# Patient Record
Sex: Female | Born: 1970
Health system: Southern US, Community
[De-identification: ages and names within clinical notes are randomized; demographics above are authoritative.]

---

## 2004-07-10 ENCOUNTER — Observation Stay (HOSPITAL_COMMUNITY): Admission: RE | Admit: 2004-07-10 | Discharge: 2004-07-11 | Payer: Self-pay | Admitting: General Surgery

## 2004-07-10 ENCOUNTER — Encounter (INDEPENDENT_AMBULATORY_CARE_PROVIDER_SITE_OTHER): Payer: Self-pay | Admitting: Specialist

## 2005-05-21 ENCOUNTER — Ambulatory Visit (HOSPITAL_COMMUNITY): Admission: RE | Admit: 2005-05-21 | Discharge: 2005-05-21 | Payer: Self-pay | Admitting: Obstetrics and Gynecology

## 2005-06-01 ENCOUNTER — Ambulatory Visit (HOSPITAL_COMMUNITY): Admission: RE | Admit: 2005-06-01 | Discharge: 2005-06-01 | Payer: Self-pay | Admitting: Obstetrics and Gynecology

## 2005-06-12 ENCOUNTER — Ambulatory Visit (HOSPITAL_COMMUNITY): Admission: RE | Admit: 2005-06-12 | Discharge: 2005-06-12 | Payer: Self-pay | Admitting: Obstetrics and Gynecology

## 2006-04-26 IMAGING — US US OB COMP LESS 14 WK
1 series · 13 of 28 positions shown · non-contrast
Comparison: none

CLINICAL DATA: Assess viability.
EARLY OBSTETRICAL ULTRASOUND: 
Multiple images of the uterus and adnexa were obtained using transabdominal and endovaginal approaches.   There is a single intrauterine gestation identified which has a gestational age by crown rump length of 6 weeks and 1 day.   Positive regular fetal cardiac activity with a rate of 120 beats per minute was noted.   A normal appearing yolk sac is seen.  
Appearing to lie within the endometrial canal and directly adjacent to the gestational sac with surrounding decidual reaction is an echogenic soft tissue mass which measures 4.3 x 3.0 x 5.6 cm.  mall pocket of fluid is identified adjacent to this echogenic soft tissue density and contains low level echoes which may represent a subchorionic hemorrhage.   An area of markedly increased echogenicity is identified within this echogenic soft tissue density and although this does not demonstrate any posterior acoustical shadowing, this findings is suggestive of the possibility of calcifications.   Color Doppler assessment reveals flow within the soft tissue component of this.  Given the patient?s history of multiple prior losses, the possibility of retained products of conception from the prior early gestational loss in Monday January, 2005 would be a consideration, especially in light of the suspected calcification.   The possibility of a partial molar gestation or focal endometrial abnormality would be secondary considerations.   Correlation with quantitative Beta HCG may be helpful to assess for partial molar gestation.   Short term follow-up in 2 weeks to reassess the appearance and assess for interval change may also be helpful in terms of differentiating between these possibilities.   This is not felt to represent a subacute and acute component of a subchorionic hemorrhage due to the flow characteristics.   
Both ovaries are seen with the right ovary measuring 2.4 x 3.0 x 1.8 cm and the left ovary measuring 2.5 x 1.7 x 1.9 cm.  No cul-de-sac or periovarian fluid is seen and no separate adnexal masses are noted.

[Series 1: us ob comp less 14 wk · 0.37mm/px · 13 of 53 slices shown]
[im 2/53]
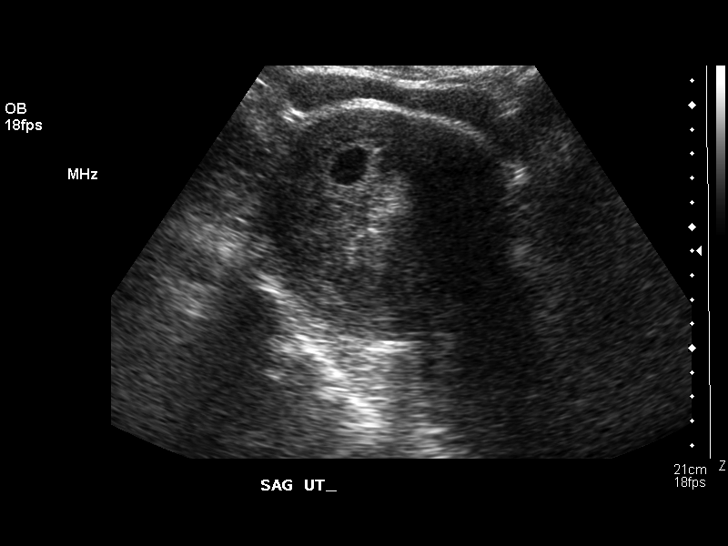
[im 6/53]
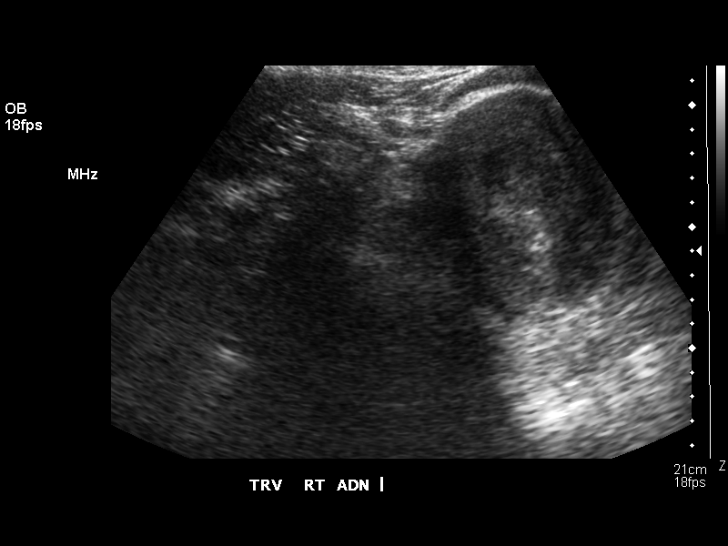
[im 10/53]
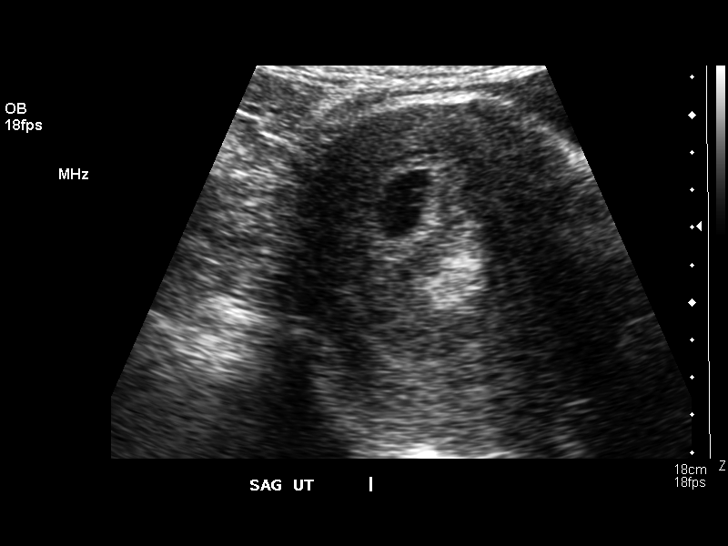
[im 14/53]
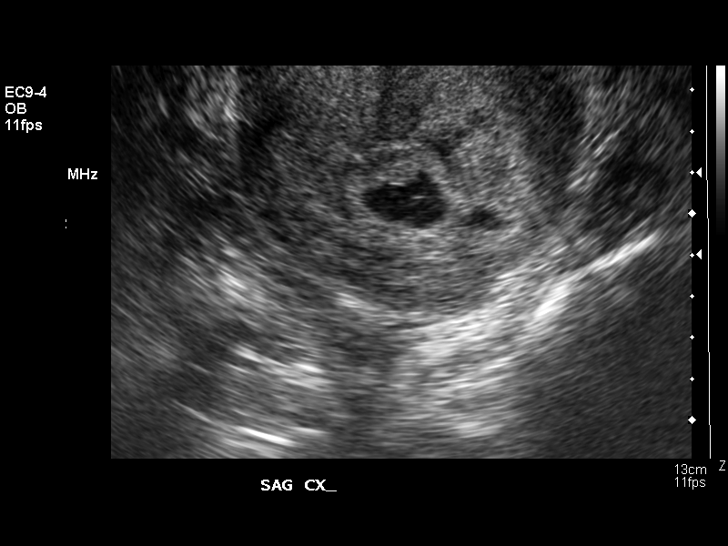
[im 18/53]
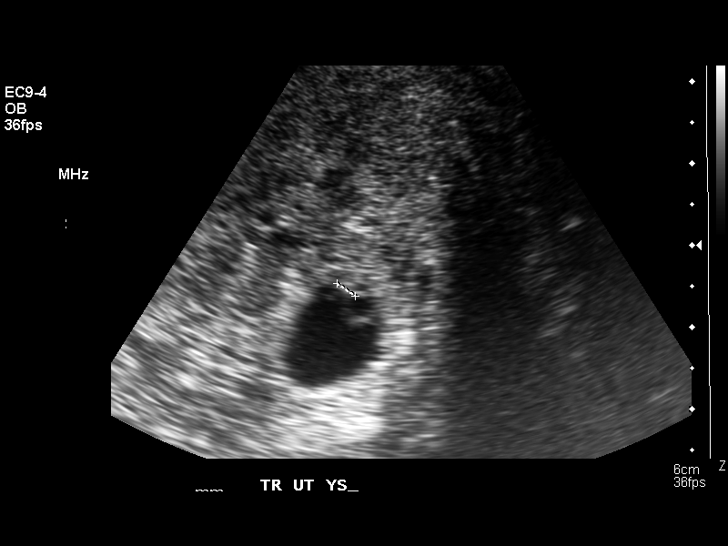
[im 22/53]
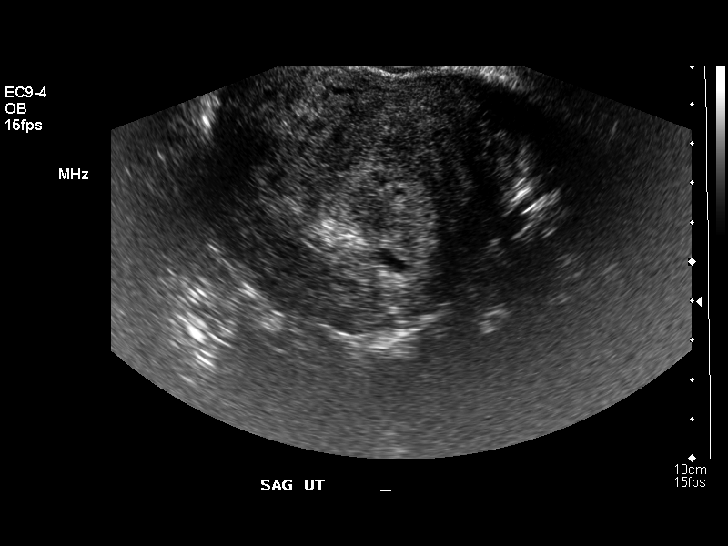
[im 27/53]
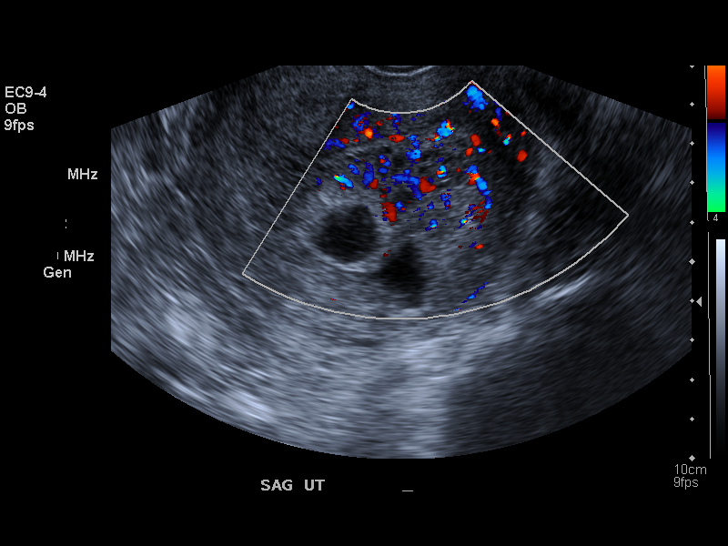
[im 31/53]
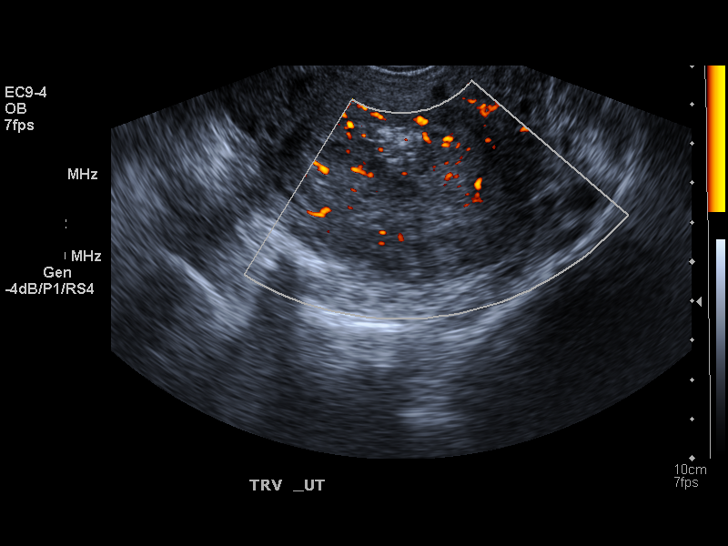
[im 35/53]
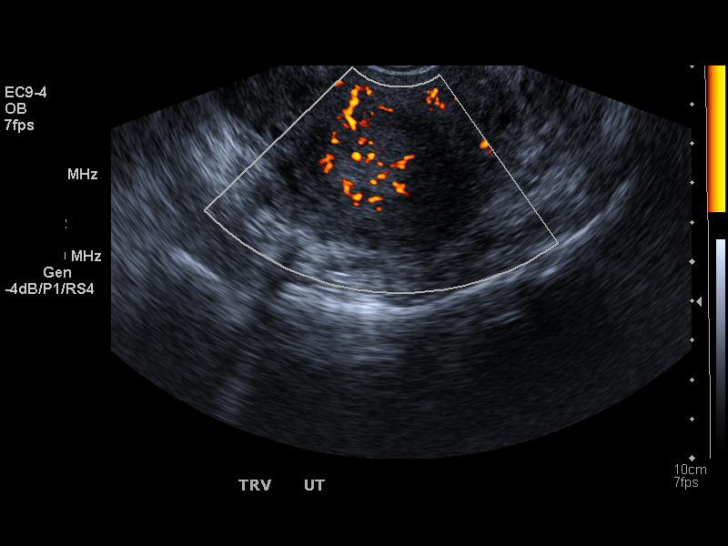
[im 39/53]
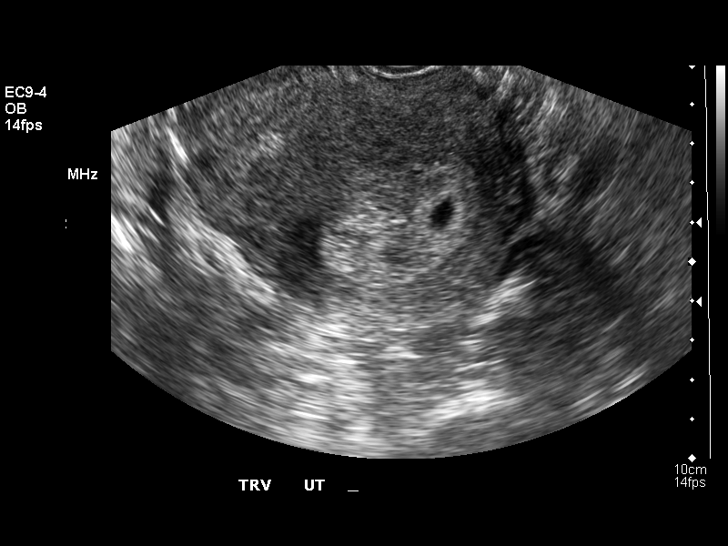
[im 43/53]
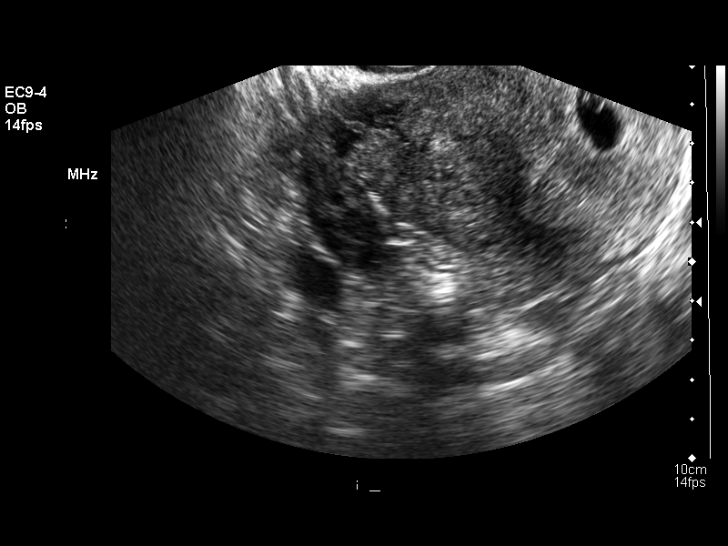
[im 47/53]
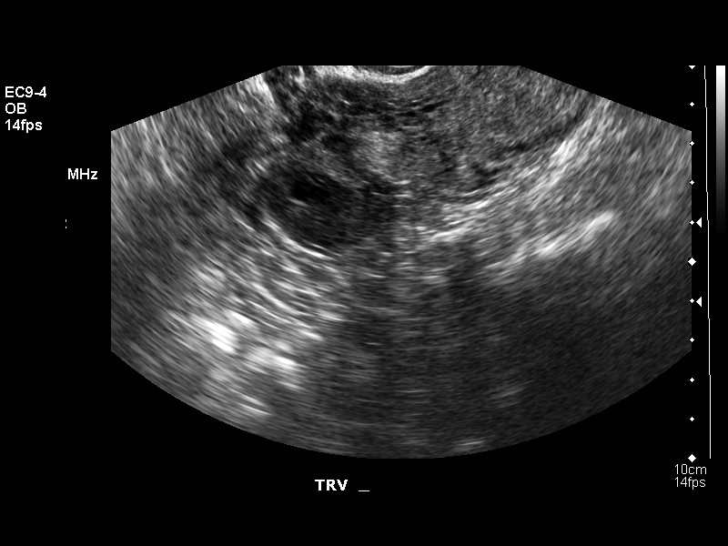
[im 51/53]
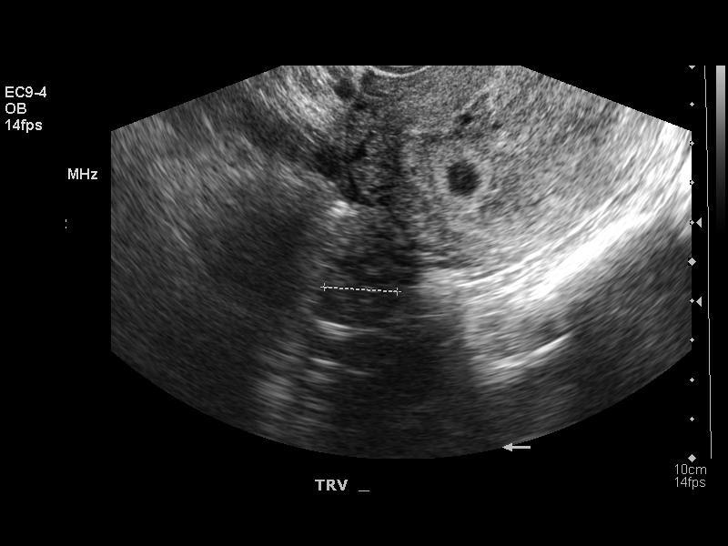

[13 of 28 positions shown; findings below may reference images not displayed]

IMPRESSION: 6 week 1 day living intrauterine pregnancy.   Adjacent soft tissue mass in the endometrial canal adjacent to the gestational sac which demonstrates suspected intralesional calcification and color flow activity.   Please see above report for differential considerations and follow-up recommendations.

## 2006-05-07 IMAGING — US US OB TRANSVAGINAL
1 series · 17 of 17 positions shown · non-contrast
Comparison: 05/21/05.

CLINICAL DATA: 7 week 5 day assigned gestational age by early ultrasound.  Follow-up mass within endometrial cavity and subchorionic hemorrhage.  Evaluate pregnancy progression.
TRANSVAGINAL OBSTETRICAL US:
TECHNIQUE: Transvaginal ultrasound was performed for evaluation of the gestation as well as the maternal uterus and adnexal regions.

[Series 1: us ob transvaginal · 17 of 17 slices shown]
[im 1/17]
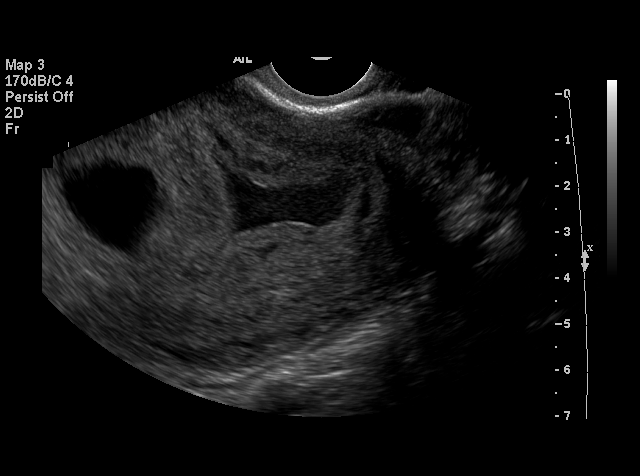
[im 2/17]
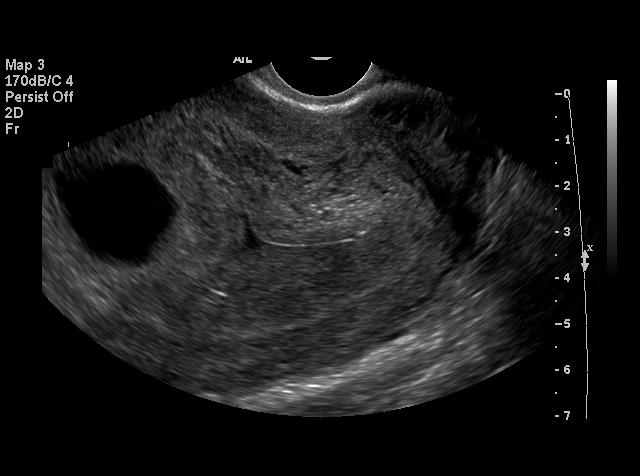
[im 3/17]
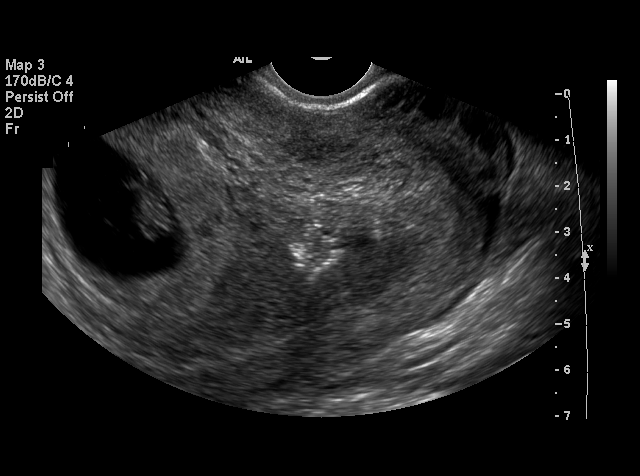
[im 4/17]
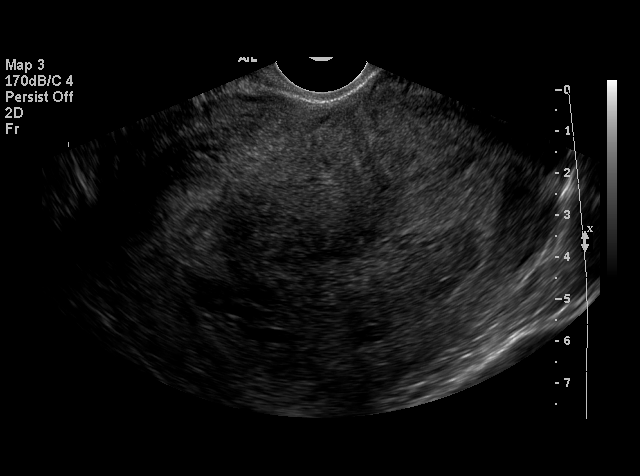
[im 5/17]
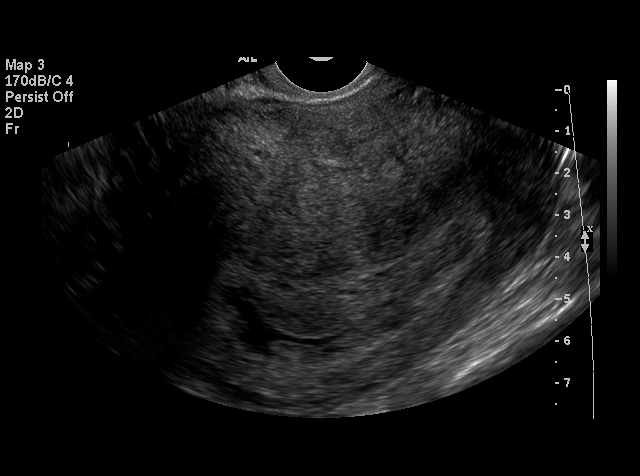
[im 6/17]
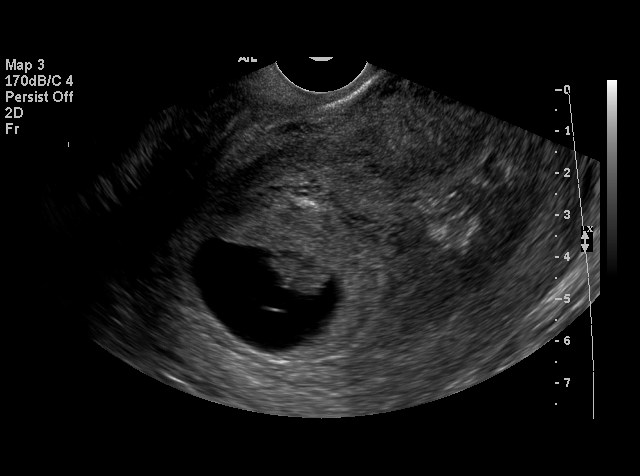
[im 7/17]
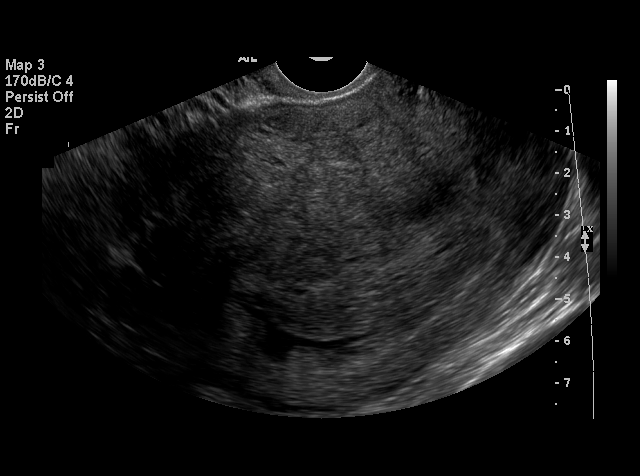
[im 8/17]
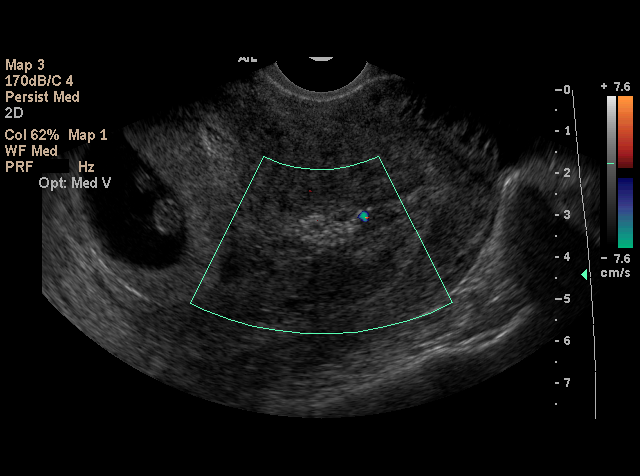
[im 9/17]
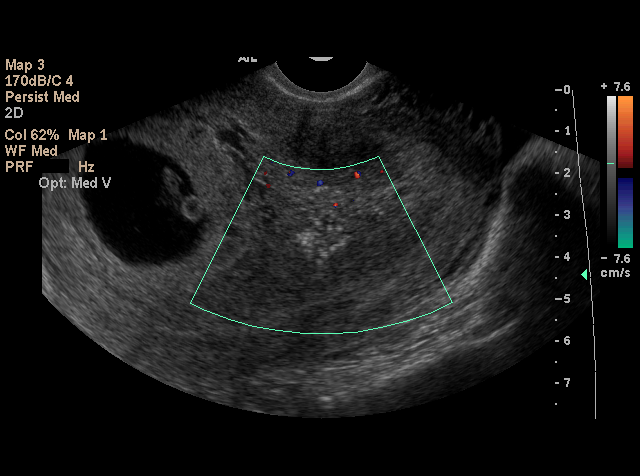
[im 10/17]
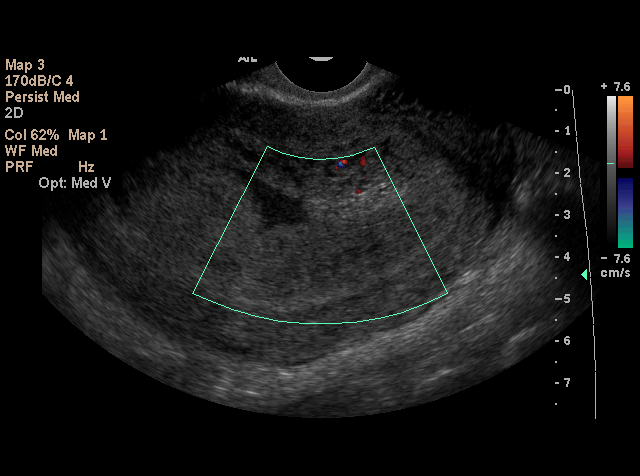
[im 11/17]
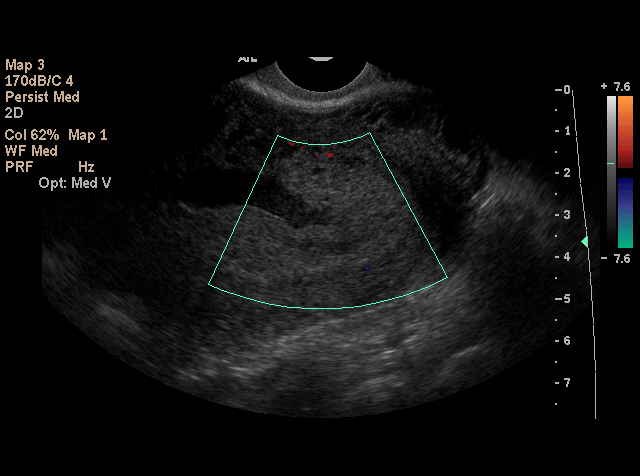
[im 12/17]
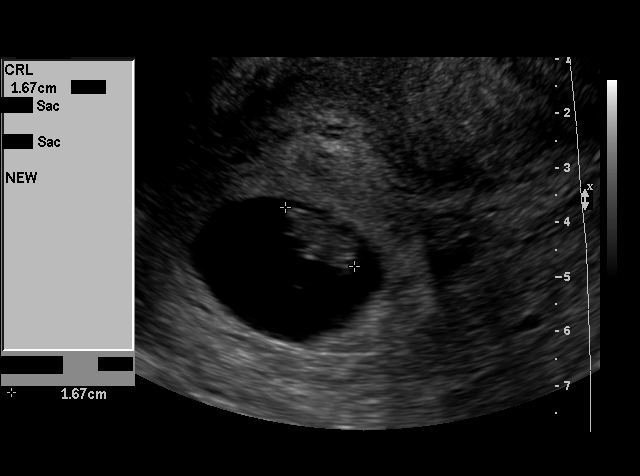
[im 13/17]
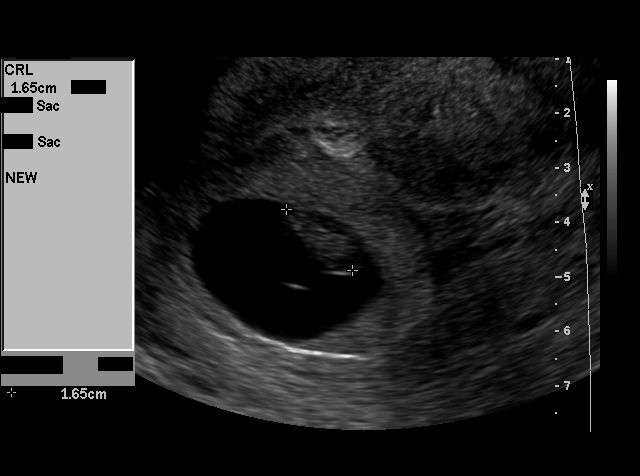
[im 14/17]
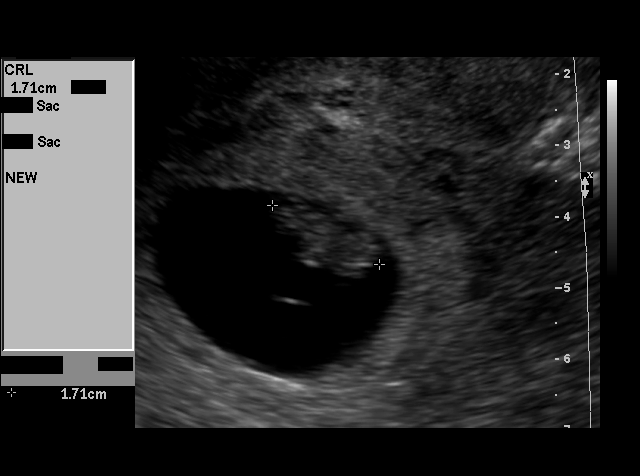
[im 15/17]
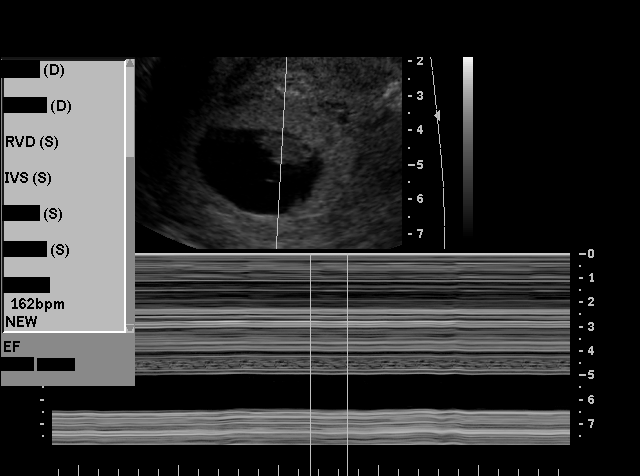
[im 16/17]
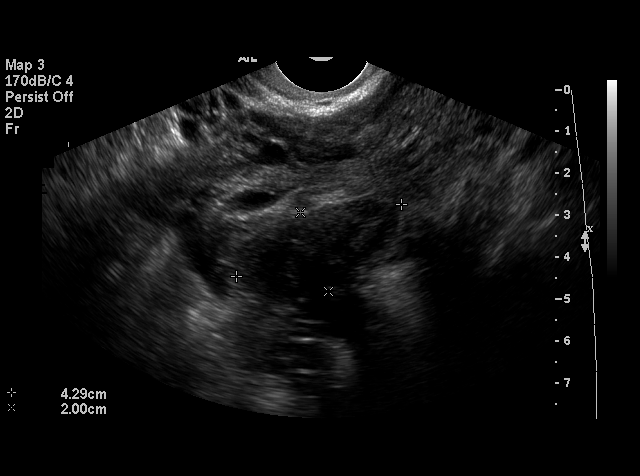
[im 17/17]
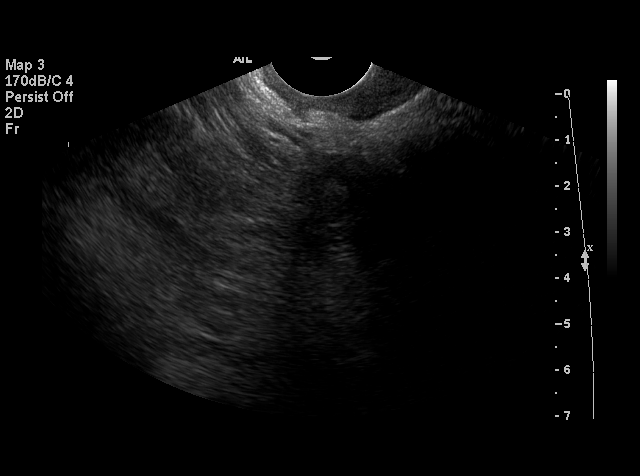

[17 of 17 positions shown; findings below may reference images not displayed]

A single living intrauterine gestation is seen with measured heart rate of 162.  Embryonic crown rump length measures 1.7 cm, corresponding with a gestational age of 8 weeks 1 day.  A normal appearing yolk sac is seen.  
A small amount of subchorionic hemorrhage is again seen along the inferior aspect of the gestational sac which is not significantly changed.  There is also abnormal soft tissue density seen in the endometrial cavity adjacent to the gestational sac which shows internal calcification.  Internal blood flow is also seen, although it is subjectively decreased when compared with amount of color flow seen on prior study.  These findings do not appear significantly changed in size or appearance since prior study.  This would favor retained products of conception related to the patient?s previous history of multiple SABs, with the possibility of a partial molar gestation considered less likely.
The right ovary is normal in appearance.  The left ovary was not directly visualized on this study, but no left adnexal masses are seen.  There is no evidence of free fluid.
IMPRESSION: 1.  Single living intrauterine gestation with assigned gestational age of 7 weeks 5 days based on prior ultrasound.  Appropriate pregnancy progression since prior study.
2.  Stable appearance of abnormal soft tissue density with calcification in the endometrial cavity, and small subchorionic hemorrhage.  Lack of change since previous study favors retained products of conception related to prior history of multiple SABs.  Partial molar gestation is considered less likely.  Continued ultrasound follow-up is recommended, as well as correlation with quantitative beta hCG.

## 2010-06-16 ENCOUNTER — Encounter: Admission: RE | Admit: 2010-06-16 | Discharge: 2010-07-15 | Payer: Self-pay

## 2011-03-06 NOTE — Op Note (Signed)
NAMESEAN, MACWILLIAMS          ACCOUNT NO.:  [id number]   MEDICAL RECORD NO.:  [id number]          PATIENT TYPE:  OBV   LOCATION:  0349                         FACILITY:  [facility]   PHYSICIAN:  Ollen Gross. Vernell Morgans, M.D. DATE OF BIRTH:  1971-06-15   DATE OF PROCEDURE:  07/10/2004  DATE OF DISCHARGE:                                 OPERATIVE REPORT   PREOPERATIVE DIAGNOSIS:  Gallstones.   POSTOPERATIVE DIAGNOSIS:  Gallstones.   PROCEDURE:  Laparoscopic cholecystectomy with intraoperative cholangiogram.   SURGEON:  Ollen Gross. Carolynne Edouard, M.D.   ASSISTANT:  Adolph Pollack, M.D.   ANESTHESIA:  General endotracheal.   PROCEDURE:  After informed consent was obtained, the patient was brought to  the operating room and placed in a supine position on the operating room  table.  After adequate induction of general anesthesia, the patient's  abdomen was prepped with Betadine and draped in the usual sterile manner.  The area below the umbilicus was infiltrated with 0.25% Marcaine.  A small  incision was made with a 15 blade knife.  This incision was carried down  through the subcutaneous tissue bluntly with a Kelly clamp and [employer]  retractors, until the linea alba was identified.  The linea alba was incised  with a 15 blade knife, and each side was grasped with Kocher clamps and  elevated anteriorly.  The pre-peritoneal space was probed bluntly with a  hemostat until the peritoneum was opened, and access was gained to the  abdominal cavity.  A 0 Vicryl purse-string stitch was placed in the fascial  surrounding the opening.  A Hasson cannula was placed through the opening,  anchored in place with the previous first Vicryl purse-string stitch.  The  abdomen was then insufflated with carbon dioxide without difficulty.  The  patient was placed in a head-up position and rotated slightly with the right  side up.  The laparoscope was placed through the Hasson cannula, and the  right upper quadrant  was inspected.  The dome of the gallbladder and liver  were readily identified.  Next, the epigastric region was infiltrated with  0.25% Marcaine.  A small incision was made with a 15 blade knife, and a 10  mm port was placed bluntly through this incision into the abdominal cavity  under direct vision.  Sites were chosen laterally on the right side of the  abdomen with placement of 5 mm ports.  Each of these areas were infiltrated  with 0.25% Marcaine.  Small stab incisions were made with a 15 blade knife,  and 5 mm ports were placed bluntly through these incisions into the  abdominal cavity under direct vision.  A blunt grasper was placed through  the lateral-most 5 mm port and used to grasp the dome of the gallbladder and  elevate it anteriorly and superiorly.  Another blunt grasper was placed  through the other 5 mm port and used to retract on the body and neck of the  gallbladder.  A dissection was placed through the epigastric port using some  blunt dissection and electrocautery.  Some adhesions of omentum to the body  of  the gallbladder were taken down.  Next, the peritoneal reflection at the  gallbladder neck area was opened using the electrocautery.  Blunt dissection  was then carried out in this area until the gallbladder neck and cystic duct  junction was readily identified, and a good window was created.  A single  clip was placed on the gallbladder neck.  A small ductotomy was made just  below the clip.  A 14 gauge angiocath was placed percutaneously through the  anterior abdominal wall under direct vision.  A Reddick cholangiogram  catheter was placed through the angiocath and flushed.  The Reddick catheter  was then placed within the cystic duct and anchored in place with a clip.  A  cholangiogram was obtained that showed no filling defects, good emptying on  the duodenum, and an adequate length on the cystic duct.  The anchoring  clips and catheters were then removed from the  patient.  Three clips were  placed on the stay side of the cystic duct, and the duct was divided between  the two sets of clips.  Posterior to this, the cystic artery was identified  and again dissected bluntly in a circumferential manner until a good window  was created.  Two clips were placed proximally, one distally on the artery,  and the artery was divided between the two.  Next, the laparoscopic hook  cautery device was used to separate the gallbladder from the liver bed prior  to completely detaching the gallbladder from the liver bed.  The liver bed  was inspected, and several small bleeding points were coagulated with the  electrocautery until the area was completely hemostatic.  The gallbladder  was then detached the rest of the way from the liver bed without difficulty.  The laparoscope was then moved to the epigastric port.  A gallbladder  grasper was placed through the Hasson cannula and used to grasp the neck of  the gallbladder.  The gallbladder with the Hasson cannula  was then removed  through the infraumbilical port without difficulty.  The fascial defect was  closed with the previous Vicryl purse-string stitch as well as with another  interrupted 0 Vicryl stitch.  The abdomen was then irrigated with copious  amounts of saline until the effluent was clear.  The rest of the ports were  all removed under direct vision and were found to be hemostatic.  Gas was  allowed to escape.  The skin incisions were all closed with interrupted 4-0  Monocryl subcuticular stitches.  Benzoin, Steri-Strips, and sterile  dressings were applied.  The patient tolerated the procedure well.  At the  end of the case, all needle, sponge, and instrument counts were correct.  Patient was then awakened and taken to the recovery room in stable  condition.      PST/MEDQ  D:  07/11/2004  T:  07/11/2004  Job:  [id number]

## 2016-08-20 DIAGNOSIS — R0981 Nasal congestion: Secondary | ICD-10-CM | POA: Diagnosis not present

## 2016-08-20 DIAGNOSIS — J342 Deviated nasal septum: Secondary | ICD-10-CM | POA: Diagnosis not present

## 2016-08-20 DIAGNOSIS — Z9889 Other specified postprocedural states: Secondary | ICD-10-CM | POA: Diagnosis not present

## 2016-08-20 DIAGNOSIS — J3489 Other specified disorders of nose and nasal sinuses: Secondary | ICD-10-CM | POA: Diagnosis not present

## 2016-09-24 DIAGNOSIS — R0981 Nasal congestion: Secondary | ICD-10-CM | POA: Diagnosis not present

## 2016-09-24 DIAGNOSIS — J342 Deviated nasal septum: Secondary | ICD-10-CM | POA: Diagnosis not present

## 2016-09-24 DIAGNOSIS — J3489 Other specified disorders of nose and nasal sinuses: Secondary | ICD-10-CM | POA: Diagnosis not present

## 2017-02-11 DIAGNOSIS — J209 Acute bronchitis, unspecified: Secondary | ICD-10-CM | POA: Diagnosis not present

## 2017-04-07 DIAGNOSIS — Z6827 Body mass index (BMI) 27.0-27.9, adult: Secondary | ICD-10-CM | POA: Diagnosis not present

## 2017-04-07 DIAGNOSIS — Z01419 Encounter for gynecological examination (general) (routine) without abnormal findings: Secondary | ICD-10-CM | POA: Diagnosis not present

## 2018-02-14 DIAGNOSIS — Z6825 Body mass index (BMI) 25.0-25.9, adult: Secondary | ICD-10-CM | POA: Diagnosis not present

## 2018-02-14 DIAGNOSIS — N390 Urinary tract infection, site not specified: Secondary | ICD-10-CM | POA: Diagnosis not present

## 2018-04-12 DIAGNOSIS — Z01419 Encounter for gynecological examination (general) (routine) without abnormal findings: Secondary | ICD-10-CM | POA: Diagnosis not present

## 2018-04-12 DIAGNOSIS — Z6823 Body mass index (BMI) 23.0-23.9, adult: Secondary | ICD-10-CM | POA: Diagnosis not present

## 2018-09-14 DIAGNOSIS — J069 Acute upper respiratory infection, unspecified: Secondary | ICD-10-CM | POA: Diagnosis not present

## 2018-09-14 DIAGNOSIS — Z6826 Body mass index (BMI) 26.0-26.9, adult: Secondary | ICD-10-CM | POA: Diagnosis not present

## 2018-09-14 DIAGNOSIS — B9789 Other viral agents as the cause of diseases classified elsewhere: Secondary | ICD-10-CM | POA: Diagnosis not present

## 2018-09-17 DIAGNOSIS — M79622 Pain in left upper arm: Secondary | ICD-10-CM | POA: Diagnosis not present

## 2018-09-17 DIAGNOSIS — M79662 Pain in left lower leg: Secondary | ICD-10-CM | POA: Diagnosis not present

## 2018-09-17 DIAGNOSIS — S60221A Contusion of right hand, initial encounter: Secondary | ICD-10-CM | POA: Diagnosis not present

## 2018-09-17 DIAGNOSIS — S8012XA Contusion of left lower leg, initial encounter: Secondary | ICD-10-CM | POA: Diagnosis not present

## 2018-09-17 DIAGNOSIS — M79641 Pain in right hand: Secondary | ICD-10-CM | POA: Diagnosis not present

## 2018-11-28 ENCOUNTER — Other Ambulatory Visit: Payer: Self-pay

## 2018-11-28 ENCOUNTER — Encounter: Payer: Self-pay | Admitting: Podiatry

## 2018-11-28 ENCOUNTER — Ambulatory Visit: Payer: BLUE CROSS/BLUE SHIELD | Admitting: Podiatry

## 2018-11-28 VITALS — BP 110/67 | HR 72 | Resp 16 | Ht 66.0 in | Wt 150.0 lb

## 2018-11-28 DIAGNOSIS — B351 Tinea unguium: Secondary | ICD-10-CM | POA: Diagnosis not present

## 2018-11-28 DIAGNOSIS — M79609 Pain in unspecified limb: Secondary | ICD-10-CM | POA: Diagnosis not present

## 2018-11-28 DIAGNOSIS — M79674 Pain in right toe(s): Secondary | ICD-10-CM

## 2018-12-17 NOTE — Progress Notes (Signed)
  Subjective:  Patient ID: Jacqueline Richardson, female    DOB: May 12, 1971,  MRN: [id number]  Chief Complaint  Patient presents with  . Nail Problem    R hallux discolored, thick and white ridges x July; 4/10 throbbing pian - no injruy Tx: none -w/ redness, and light swelling around nail bed -pt deneis N/V/F?Ch    48 y.o. female presents with the above complaint.  History above confirmed with patient  Review of Systems: Negative except as noted in the HPI. Denies N/V/F/Ch.  No past medical history on file.  Current Outpatient Medications:  .  drospirenone-ethinyl estradiol (YASMIN,ZARAH,SYEDA) 3-0.03 MG tablet, Take 1 tablet by mouth daily., Disp: , Rfl:   Social History   Tobacco Use  Smoking Status Never Smoker  Smokeless Tobacco Never Used    Not on File Objective:   Vitals:   11/28/18 1342  BP: 110/67  Pulse: 72  Resp: 16   Body mass index is 24.21 kg/m. Constitutional Well developed. Well nourished.  Vascular Dorsalis pedis pulses palpable bilaterally. Posterior tibial pulses palpable bilaterally. Capillary refill normal to all digits.  No cyanosis or clubbing noted. Pedal hair growth normal.  Neurologic Normal speech. Oriented to person, place, and time. Epicritic sensation to light touch grossly present bilaterally.  Dermatologic Nails right hallux nail dystrophic thickened transverse ridging No open wounds. No skin lesions.  Orthopedic: Normal joint ROM without pain or crepitus bilaterally. No visible deformities. No bony tenderness.   Radiographs: None Assessment:   1. Great toe pain, right   2. Pain due to onychomycosis of nail    Plan:  Patient was evaluated and treated and all questions answered.  Nail dystrophy right -Discussed underlying etiology most likely secondary to trauma.  Nail positive debrided with nail nipper and rotary bur.  No follow-ups on file.

## 2018-12-21 DIAGNOSIS — Z111 Encounter for screening for respiratory tuberculosis: Secondary | ICD-10-CM | POA: Diagnosis not present

## 2018-12-21 DIAGNOSIS — Z1331 Encounter for screening for depression: Secondary | ICD-10-CM | POA: Diagnosis not present

## 2018-12-21 DIAGNOSIS — Z6826 Body mass index (BMI) 26.0-26.9, adult: Secondary | ICD-10-CM | POA: Diagnosis not present

## 2018-12-21 DIAGNOSIS — Z0000 Encounter for general adult medical examination without abnormal findings: Secondary | ICD-10-CM | POA: Diagnosis not present

## 2018-12-21 DIAGNOSIS — Z13220 Encounter for screening for lipoid disorders: Secondary | ICD-10-CM | POA: Diagnosis not present

## 2019-05-04 DIAGNOSIS — Z130 Encounter for screening for diseases of the blood and blood-forming organs and certain disorders involving the immune mechanism: Secondary | ICD-10-CM | POA: Diagnosis not present

## 2019-05-04 DIAGNOSIS — N920 Excessive and frequent menstruation with regular cycle: Secondary | ICD-10-CM | POA: Diagnosis not present

## 2019-05-04 DIAGNOSIS — Z01419 Encounter for gynecological examination (general) (routine) without abnormal findings: Secondary | ICD-10-CM | POA: Diagnosis not present

## 2019-05-04 DIAGNOSIS — Z6827 Body mass index (BMI) 27.0-27.9, adult: Secondary | ICD-10-CM | POA: Diagnosis not present

## 2019-05-09 DIAGNOSIS — D259 Leiomyoma of uterus, unspecified: Secondary | ICD-10-CM | POA: Diagnosis not present

## 2019-05-09 DIAGNOSIS — N920 Excessive and frequent menstruation with regular cycle: Secondary | ICD-10-CM | POA: Diagnosis not present

## 2020-05-15 DIAGNOSIS — Z01419 Encounter for gynecological examination (general) (routine) without abnormal findings: Secondary | ICD-10-CM | POA: Diagnosis not present

## 2020-05-15 DIAGNOSIS — Z6824 Body mass index (BMI) 24.0-24.9, adult: Secondary | ICD-10-CM | POA: Diagnosis not present

## 2020-05-21 DIAGNOSIS — N938 Other specified abnormal uterine and vaginal bleeding: Secondary | ICD-10-CM | POA: Diagnosis not present

## 2020-05-21 DIAGNOSIS — D259 Leiomyoma of uterus, unspecified: Secondary | ICD-10-CM | POA: Diagnosis not present

## 2021-01-14 DIAGNOSIS — Z6825 Body mass index (BMI) 25.0-25.9, adult: Secondary | ICD-10-CM | POA: Diagnosis not present

## 2021-01-14 DIAGNOSIS — Z0000 Encounter for general adult medical examination without abnormal findings: Secondary | ICD-10-CM | POA: Diagnosis not present

## 2021-01-14 DIAGNOSIS — Z1331 Encounter for screening for depression: Secondary | ICD-10-CM | POA: Diagnosis not present

## 2021-01-14 DIAGNOSIS — E041 Nontoxic single thyroid nodule: Secondary | ICD-10-CM | POA: Diagnosis not present

## 2021-01-14 DIAGNOSIS — Z13220 Encounter for screening for lipoid disorders: Secondary | ICD-10-CM | POA: Diagnosis not present

## 2021-01-15 DIAGNOSIS — E041 Nontoxic single thyroid nodule: Secondary | ICD-10-CM | POA: Diagnosis not present

## 2021-03-28 DIAGNOSIS — E041 Nontoxic single thyroid nodule: Secondary | ICD-10-CM | POA: Diagnosis not present

## 2021-03-28 DIAGNOSIS — Z808 Family history of malignant neoplasm of other organs or systems: Secondary | ICD-10-CM | POA: Diagnosis not present

## 2021-04-09 DIAGNOSIS — Z6825 Body mass index (BMI) 25.0-25.9, adult: Secondary | ICD-10-CM | POA: Diagnosis not present

## 2021-04-09 DIAGNOSIS — Z20828 Contact with and (suspected) exposure to other viral communicable diseases: Secondary | ICD-10-CM | POA: Diagnosis not present

## 2021-04-09 DIAGNOSIS — J209 Acute bronchitis, unspecified: Secondary | ICD-10-CM | POA: Diagnosis not present

## 2021-07-08 DIAGNOSIS — Z124 Encounter for screening for malignant neoplasm of cervix: Secondary | ICD-10-CM | POA: Diagnosis not present

## 2021-07-08 DIAGNOSIS — Z1151 Encounter for screening for human papillomavirus (HPV): Secondary | ICD-10-CM | POA: Diagnosis not present

## 2021-07-08 DIAGNOSIS — Z01419 Encounter for gynecological examination (general) (routine) without abnormal findings: Secondary | ICD-10-CM | POA: Diagnosis not present

## 2021-07-08 DIAGNOSIS — Z6824 Body mass index (BMI) 24.0-24.9, adult: Secondary | ICD-10-CM | POA: Diagnosis not present

## 2021-08-12 DIAGNOSIS — R5383 Other fatigue: Secondary | ICD-10-CM | POA: Diagnosis not present

## 2021-08-12 DIAGNOSIS — N951 Menopausal and female climacteric states: Secondary | ICD-10-CM | POA: Diagnosis not present

## 2021-08-18 DIAGNOSIS — N951 Menopausal and female climacteric states: Secondary | ICD-10-CM | POA: Diagnosis not present

## 2021-12-31 DIAGNOSIS — M19042 Primary osteoarthritis, left hand: Secondary | ICD-10-CM | POA: Diagnosis not present

## 2021-12-31 DIAGNOSIS — G5603 Carpal tunnel syndrome, bilateral upper limbs: Secondary | ICD-10-CM | POA: Diagnosis not present

## 2022-02-18 DIAGNOSIS — M25531 Pain in right wrist: Secondary | ICD-10-CM | POA: Diagnosis not present

## 2022-02-18 DIAGNOSIS — R202 Paresthesia of skin: Secondary | ICD-10-CM | POA: Diagnosis not present

## 2022-02-18 DIAGNOSIS — M25532 Pain in left wrist: Secondary | ICD-10-CM | POA: Diagnosis not present

## 2022-03-05 DIAGNOSIS — D259 Leiomyoma of uterus, unspecified: Secondary | ICD-10-CM | POA: Diagnosis not present

## 2022-03-05 DIAGNOSIS — N958 Other specified menopausal and perimenopausal disorders: Secondary | ICD-10-CM | POA: Diagnosis not present

## 2022-03-05 DIAGNOSIS — N938 Other specified abnormal uterine and vaginal bleeding: Secondary | ICD-10-CM | POA: Diagnosis not present

## 2022-05-19 DIAGNOSIS — N939 Abnormal uterine and vaginal bleeding, unspecified: Secondary | ICD-10-CM | POA: Diagnosis not present

## 2022-05-19 DIAGNOSIS — N938 Other specified abnormal uterine and vaginal bleeding: Secondary | ICD-10-CM | POA: Diagnosis not present

## 2022-06-01 DIAGNOSIS — M19042 Primary osteoarthritis, left hand: Secondary | ICD-10-CM | POA: Diagnosis not present

## 2022-07-14 DIAGNOSIS — Z01411 Encounter for gynecological examination (general) (routine) with abnormal findings: Secondary | ICD-10-CM | POA: Diagnosis not present

## 2022-07-14 DIAGNOSIS — N938 Other specified abnormal uterine and vaginal bleeding: Secondary | ICD-10-CM | POA: Diagnosis not present

## 2022-07-14 DIAGNOSIS — Z6829 Body mass index (BMI) 29.0-29.9, adult: Secondary | ICD-10-CM | POA: Diagnosis not present

## 2022-07-14 DIAGNOSIS — N3281 Overactive bladder: Secondary | ICD-10-CM | POA: Diagnosis not present

## 2022-08-06 DIAGNOSIS — Z6829 Body mass index (BMI) 29.0-29.9, adult: Secondary | ICD-10-CM | POA: Diagnosis not present

## 2022-08-06 DIAGNOSIS — R252 Cramp and spasm: Secondary | ICD-10-CM | POA: Diagnosis not present

## 2022-08-06 DIAGNOSIS — Z789 Other specified health status: Secondary | ICD-10-CM | POA: Diagnosis not present

## 2022-08-06 DIAGNOSIS — E785 Hyperlipidemia, unspecified: Secondary | ICD-10-CM | POA: Diagnosis not present

## 2022-08-06 DIAGNOSIS — D519 Vitamin B12 deficiency anemia, unspecified: Secondary | ICD-10-CM | POA: Diagnosis not present

## 2023-03-14 DIAGNOSIS — T23202A Burn of second degree of left hand, unspecified site, initial encounter: Secondary | ICD-10-CM | POA: Diagnosis not present

## 2023-03-14 DIAGNOSIS — M79642 Pain in left hand: Secondary | ICD-10-CM | POA: Diagnosis not present

## 2023-07-19 DIAGNOSIS — B3731 Acute candidiasis of vulva and vagina: Secondary | ICD-10-CM | POA: Diagnosis not present

## 2023-07-19 DIAGNOSIS — Z01411 Encounter for gynecological examination (general) (routine) with abnormal findings: Secondary | ICD-10-CM | POA: Diagnosis not present

## 2023-07-19 DIAGNOSIS — Z1331 Encounter for screening for depression: Secondary | ICD-10-CM | POA: Diagnosis not present

## 2023-07-19 DIAGNOSIS — Z6829 Body mass index (BMI) 29.0-29.9, adult: Secondary | ICD-10-CM | POA: Diagnosis not present

## 2024-05-17 DIAGNOSIS — R194 Change in bowel habit: Secondary | ICD-10-CM | POA: Diagnosis not present

## 2024-05-17 DIAGNOSIS — R109 Unspecified abdominal pain: Secondary | ICD-10-CM | POA: Diagnosis not present

## 2024-05-24 DIAGNOSIS — D225 Melanocytic nevi of trunk: Secondary | ICD-10-CM | POA: Diagnosis not present

## 2024-05-31 DIAGNOSIS — E041 Nontoxic single thyroid nodule: Secondary | ICD-10-CM | POA: Diagnosis not present

## 2024-05-31 DIAGNOSIS — Z131 Encounter for screening for diabetes mellitus: Secondary | ICD-10-CM | POA: Diagnosis not present

## 2024-05-31 DIAGNOSIS — Z0000 Encounter for general adult medical examination without abnormal findings: Secondary | ICD-10-CM | POA: Diagnosis not present

## 2024-05-31 DIAGNOSIS — Z1339 Encounter for screening examination for other mental health and behavioral disorders: Secondary | ICD-10-CM | POA: Diagnosis not present

## 2024-05-31 DIAGNOSIS — Z1331 Encounter for screening for depression: Secondary | ICD-10-CM | POA: Diagnosis not present

## 2024-05-31 DIAGNOSIS — Z6832 Body mass index (BMI) 32.0-32.9, adult: Secondary | ICD-10-CM | POA: Diagnosis not present

## 2024-06-21 DIAGNOSIS — K2100 Gastro-esophageal reflux disease with esophagitis, without bleeding: Secondary | ICD-10-CM | POA: Diagnosis not present

## 2024-06-21 DIAGNOSIS — Z79899 Other long term (current) drug therapy: Secondary | ICD-10-CM | POA: Diagnosis not present

## 2024-06-21 DIAGNOSIS — Z1211 Encounter for screening for malignant neoplasm of colon: Secondary | ICD-10-CM | POA: Diagnosis not present

## 2024-06-21 DIAGNOSIS — M069 Rheumatoid arthritis, unspecified: Secondary | ICD-10-CM | POA: Diagnosis not present

## 2024-06-21 DIAGNOSIS — R1310 Dysphagia, unspecified: Secondary | ICD-10-CM | POA: Diagnosis not present

## 2024-06-21 DIAGNOSIS — Z791 Long term (current) use of non-steroidal anti-inflammatories (NSAID): Secondary | ICD-10-CM | POA: Diagnosis not present

## 2024-06-21 DIAGNOSIS — K222 Esophageal obstruction: Secondary | ICD-10-CM | POA: Diagnosis not present

## 2024-06-21 DIAGNOSIS — Z886 Allergy status to analgesic agent status: Secondary | ICD-10-CM | POA: Diagnosis not present

## 2024-06-21 DIAGNOSIS — K449 Diaphragmatic hernia without obstruction or gangrene: Secondary | ICD-10-CM | POA: Diagnosis not present

## 2024-06-21 DIAGNOSIS — K5730 Diverticulosis of large intestine without perforation or abscess without bleeding: Secondary | ICD-10-CM | POA: Diagnosis not present

## 2024-06-26 DIAGNOSIS — M545 Low back pain, unspecified: Secondary | ICD-10-CM | POA: Diagnosis not present

## 2024-06-26 DIAGNOSIS — M47817 Spondylosis without myelopathy or radiculopathy, lumbosacral region: Secondary | ICD-10-CM | POA: Diagnosis not present

## 2024-06-26 DIAGNOSIS — M5416 Radiculopathy, lumbar region: Secondary | ICD-10-CM | POA: Diagnosis not present

## 2024-06-26 DIAGNOSIS — M4316 Spondylolisthesis, lumbar region: Secondary | ICD-10-CM | POA: Diagnosis not present

## 2024-07-06 DIAGNOSIS — Z6831 Body mass index (BMI) 31.0-31.9, adult: Secondary | ICD-10-CM | POA: Diagnosis not present

## 2024-07-06 DIAGNOSIS — J302 Other seasonal allergic rhinitis: Secondary | ICD-10-CM | POA: Diagnosis not present
